# Patient Record
Sex: Female | Born: 1990 | Race: Black or African American | Hispanic: No | Marital: Single | Smoking: Never smoker
Health system: Southern US, Community
[De-identification: ages and names within clinical notes are randomized; demographics above are authoritative.]

## PROBLEM LIST (undated history)

## (undated) ENCOUNTER — Inpatient Hospital Stay (HOSPITAL_COMMUNITY): Payer: Self-pay

## (undated) DIAGNOSIS — Z789 Other specified health status: Secondary | ICD-10-CM

## (undated) HISTORY — PX: HERNIA REPAIR: SHX51

## (undated) HISTORY — PX: NO PAST SURGERIES: SHX2092

## (undated) HISTORY — PX: WISDOM TOOTH EXTRACTION: SHX21

---

## 2012-12-24 NOTE — L&D Delivery Note (Signed)
Delivery Note At 3:25 AM a viable female was delivered via Vaginal, Spontaneous Delivery (Presentation:LOA ;  ).  APGAR: 9, 9; weight .   Placenta status: Intact, Spontaneous.  Cord: 3 vessels with the following complications: None.  Cord pH: n/a  Anesthesia: None  Episiotomy: None Lacerations: None Suture Repair: n/a Est. Blood Loss (mL): 300  Mom to postpartum.  Baby to Couplet care / Skin to Skin.  LAWSON, MARIE DARLENE 11/22/2013, 3:35 AM

## 2013-08-31 LAB — OB RESULTS CONSOLE HIV ANTIBODY (ROUTINE TESTING): HIV: NONREACTIVE

## 2013-09-07 ENCOUNTER — Encounter (HOSPITAL_COMMUNITY): Payer: Self-pay

## 2013-09-07 ENCOUNTER — Emergency Department (HOSPITAL_COMMUNITY)
Admission: EM | Admit: 2013-09-07 | Discharge: 2013-09-07 | Disposition: A | Discharge status: Home / Self Care | Payer: Medicaid Other | Attending: Emergency Medicine | Admitting: Emergency Medicine

## 2013-09-07 DIAGNOSIS — E860 Dehydration: Secondary | ICD-10-CM | POA: Insufficient documentation

## 2013-09-07 DIAGNOSIS — R42 Dizziness and giddiness: Secondary | ICD-10-CM | POA: Insufficient documentation

## 2013-09-07 DIAGNOSIS — R55 Syncope and collapse: Secondary | ICD-10-CM | POA: Insufficient documentation

## 2013-09-07 DIAGNOSIS — O9989 Other specified diseases and conditions complicating pregnancy, childbirth and the puerperium: Secondary | ICD-10-CM | POA: Insufficient documentation

## 2013-09-07 DIAGNOSIS — R109 Unspecified abdominal pain: Secondary | ICD-10-CM | POA: Insufficient documentation

## 2013-09-07 DIAGNOSIS — Z3490 Encounter for supervision of normal pregnancy, unspecified, unspecified trimester: Secondary | ICD-10-CM

## 2013-09-07 HISTORY — DX: Other specified health status: Z78.9

## 2013-09-07 LAB — URINALYSIS, ROUTINE W REFLEX MICROSCOPIC
Ketones, ur: NEGATIVE mg/dL
Leukocytes, UA: NEGATIVE
Nitrite: NEGATIVE
Specific Gravity, Urine: 1.011 (ref 1.005–1.030)
pH: 7 (ref 5.0–8.0)

## 2013-09-07 LAB — CBC WITH DIFFERENTIAL/PLATELET
Basophils Absolute: 0 10*3/uL (ref 0.0–0.1)
HCT: 28.4 % — ABNORMAL LOW (ref 36.0–46.0)
Lymphocytes Relative: 9 % — ABNORMAL LOW (ref 12–46)
Monocytes Relative: 10 % (ref 3–12)
Platelets: 318 10*3/uL (ref 150–400)
RDW: 12.9 % (ref 11.5–15.5)
WBC: 16.5 10*3/uL — ABNORMAL HIGH (ref 4.0–10.5)

## 2013-09-07 LAB — BASIC METABOLIC PANEL
Chloride: 105 mEq/L (ref 96–112)
GFR calc Af Amer: >90 mL/min (ref >90)
Potassium: 3.9 mEq/L (ref 3.5–5.1)

## 2013-09-07 MED ORDER — SODIUM CHLORIDE 0.9 % IV BOLUS (SEPSIS)
1000.0000 mL | Freq: Once | INTRAVENOUS | Status: AC
  Administered 2013-09-07: 1000 mL via INTRAVENOUS

## 2013-09-07 NOTE — ED Notes (Signed)
Pt states she is [redacted] weeks pregnant, became dizzy when getting ready this morning, sat down, then went to walk into living room when she became weak and off balance and fell on bed, states she fell on R side, pt states some lower abdominal cramping started after fall, denies syncope episode, denies vaginal bleeding, pt resting a this time, just recently moved here, to see new OB MD on Thursday.

## 2013-09-07 NOTE — ED Notes (Addendum)
Pt c/o dizziness since 0800 and c/o lower abdominal cramping.  Pain score 5/10.  Sts she fell this AM from dizziness.  Pt sts she is "7months pregnant."  Denies vaginal bleeding and discharge.  Sts she has not been eating well.  Vitals are stable.
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
## 2013-09-07 NOTE — ED Notes (Signed)
Rapid Response OB RN at bedside 

## 2013-09-07 NOTE — Progress Notes (Signed)
Dr. Jolayne Panther called reassured FHR tracing for 3.5 hours with one moderate variable lasting 70 secs with contraction every 1-2 an hour. Asked to make sure pt has established care. Pt states she has made appointment with [facility] for this Thursday. Dr. Jolayne Panther stated that pt could be discharged if remaining 30 min monitoring was reassuring, pt established care and give fetal kick counts instructions for discharge.

## 2013-09-07 NOTE — Progress Notes (Signed)
Dr. Bernette Mayers, notified of Dr. Jolayne Panther clearing pt obstetrically. Orders for discharge instructions include fetal kick counts. Dr. Bernette Mayers verified understanding.

## 2013-09-07 NOTE — ED Notes (Signed)
Pt unable to urinate at this time. 1st attempt unsuccessful.

## 2013-09-07 NOTE — Progress Notes (Signed)
At bedside. Report received from Dr. Bernette Mayers. Pt states she "fainted and fell on right side." Pt reports cramping like "menstrual cramps." Reports positive fetal movement. No vaginal bleeding or leaking of fluid. Pt states she is new to the area and has been receiving care with [facility]. And has a scheduled appointment with an OB for next week.
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
## 2013-09-07 NOTE — Progress Notes (Signed)
[facility] ED called regarding pt who at 29 wk 1 d had syncope episode and had lower abd cramping. OB RR RN in route.

## 2013-09-10 LAB — OB RESULTS CONSOLE ABO/RH: RH Type: POSITIVE

## 2013-10-22 LAB — OB RESULTS CONSOLE GC/CHLAMYDIA
Chlamydia: NEGATIVE
Gonorrhea: NEGATIVE

## 2013-10-22 LAB — OB RESULTS CONSOLE GBS: GBS: NEGATIVE

## 2013-11-01 ENCOUNTER — Encounter (HOSPITAL_COMMUNITY): Payer: Self-pay | Admitting: Emergency Medicine

## 2013-11-01 ENCOUNTER — Inpatient Hospital Stay (HOSPITAL_COMMUNITY)
Admission: EM | Admit: 2013-11-01 | Discharge: 2013-11-02 | Disposition: A | Discharge status: Home / Self Care | Payer: No Typology Code available for payment source | Attending: Obstetrics and Gynecology | Admitting: Obstetrics and Gynecology

## 2013-11-01 ENCOUNTER — Inpatient Hospital Stay (HOSPITAL_COMMUNITY): Payer: No Typology Code available for payment source

## 2013-11-01 DIAGNOSIS — Y92410 Unspecified street and highway as the place of occurrence of the external cause: Secondary | ICD-10-CM | POA: Insufficient documentation

## 2013-11-01 DIAGNOSIS — M545 Low back pain, unspecified: Secondary | ICD-10-CM | POA: Insufficient documentation

## 2013-11-01 DIAGNOSIS — R109 Unspecified abdominal pain: Secondary | ICD-10-CM | POA: Insufficient documentation

## 2013-11-01 DIAGNOSIS — O2693 Pregnancy related conditions, unspecified, third trimester: Secondary | ICD-10-CM

## 2013-11-01 DIAGNOSIS — O479 False labor, unspecified: Secondary | ICD-10-CM | POA: Insufficient documentation

## 2013-11-01 LAB — COMPREHENSIVE METABOLIC PANEL
ALT: 12 U/L (ref 0–35)
AST: 22 U/L (ref 0–37)
Albumin: 2.6 g/dL — ABNORMAL LOW (ref 3.5–5.2)
Alkaline Phosphatase: 163 U/L — ABNORMAL HIGH (ref 39–117)
GFR calc Af Amer: >90 mL/min (ref >90)
Glucose, Bld: 75 mg/dL (ref 70–99)
Potassium: 3.7 mEq/L (ref 3.5–5.1)
Sodium: 136 mEq/L (ref 135–145)
Total Protein: 6.7 g/dL (ref 6.0–8.3)

## 2013-11-01 LAB — ABO/RH: ABO/RH(D): O POS

## 2013-11-01 LAB — CBC WITH DIFFERENTIAL/PLATELET
Basophils Absolute: 0 10*3/uL (ref 0.0–0.1)
Eosinophils Absolute: 0.2 10*3/uL (ref 0.0–0.7)
Lymphs Abs: 1.7 10*3/uL (ref 0.7–4.0)
MCH: 29.5 pg (ref 26.0–34.0)
Neutrophils Relative %: 75 % (ref 43–77)
Platelets: 281 10*3/uL (ref 150–400)
RBC: 4.14 MIL/uL (ref 3.87–5.11)
WBC: 13.9 10*3/uL — ABNORMAL HIGH (ref 4.0–10.5)

## 2013-11-01 LAB — URIC ACID: Uric Acid, Serum: 4.5 mg/dL (ref 2.4–7.0)

## 2013-11-01 MED ORDER — LACTATED RINGERS IV BOLUS (SEPSIS)
500.0000 mL | Freq: Once | INTRAVENOUS | Status: AC
  Administered 2013-11-01: 21:00:00 via INTRAVENOUS

## 2013-11-01 MED ORDER — LACTATED RINGERS IV SOLN
INTRAVENOUS | Status: DC
  Administered 2013-11-01: 21:00:00 via INTRAVENOUS

## 2013-11-01 NOTE — Progress Notes (Signed)
RROB gave report to Lauren,[email or web address] -maternity admissions
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
## 2013-11-01 NOTE — ED Notes (Signed)
Fetal HR 140 on transfer
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
## 2013-11-01 NOTE — Progress Notes (Signed)
[facility] here to transport pt to [facility].  Monitors d/c'd
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
## 2013-11-02 DIAGNOSIS — O2690 Pregnancy related conditions, unspecified, unspecified trimester: Secondary | ICD-10-CM

## 2013-11-02 DIAGNOSIS — R1084 Generalized abdominal pain: Secondary | ICD-10-CM

## 2013-11-21 ENCOUNTER — Inpatient Hospital Stay (HOSPITAL_COMMUNITY)
Admission: AD | Admit: 2013-11-21 | Discharge: 2013-11-24 | DRG: 775 | Disposition: A | Discharge status: Home / Self Care | Payer: Medicaid Other | Source: Ambulatory Visit | Attending: Obstetrics and Gynecology | Admitting: Obstetrics and Gynecology

## 2013-11-21 DIAGNOSIS — IMO0001 Reserved for inherently not codable concepts without codable children: Secondary | ICD-10-CM

## 2013-11-22 ENCOUNTER — Encounter (HOSPITAL_COMMUNITY): Payer: Self-pay | Admitting: *Deleted

## 2013-11-22 DIAGNOSIS — IMO0001 Reserved for inherently not codable concepts without codable children: Secondary | ICD-10-CM

## 2013-11-22 LAB — CBC
HCT: 35.1 % — ABNORMAL LOW (ref 36.0–46.0)
Hemoglobin: 11.7 g/dL — ABNORMAL LOW (ref 12.0–15.0)
MCHC: 33.3 g/dL (ref 30.0–36.0)
MCV: 85.6 fL (ref 78.0–100.0)
RBC: 4.1 MIL/uL (ref 3.87–5.11)
WBC: 20.7 10*3/uL — ABNORMAL HIGH (ref 4.0–10.5)

## 2013-11-22 MED ORDER — CITRIC ACID-SODIUM CITRATE 334-500 MG/5ML PO SOLN: 30.0000 mL | ORAL | Status: DC | PRN

## 2013-11-22 MED ORDER — DIBUCAINE 1 % RE OINT: 1.0000 "application " | TOPICAL_OINTMENT | RECTAL | Status: DC | PRN

## 2013-11-22 MED ORDER — PRENATAL MULTIVITAMIN CH
1.0000 | ORAL_TABLET | Freq: Every day | ORAL | Status: DC
  Administered 2013-11-22 – 2013-11-24 (×3): 1 via ORAL
  Filled 2013-11-22 (×3): qty 1

## 2013-11-22 MED ORDER — ONDANSETRON HCL 4 MG/2ML IJ SOLN: 4.0000 mg | INTRAMUSCULAR | Status: DC | PRN

## 2013-11-22 MED ORDER — ZOLPIDEM TARTRATE 5 MG PO TABS: 5.0000 mg | ORAL_TABLET | Freq: Every evening | ORAL | Status: DC | PRN

## 2013-11-22 MED ORDER — SENNOSIDES-DOCUSATE SODIUM 8.6-50 MG PO TABS
2.0000 | ORAL_TABLET | ORAL | Status: DC
  Administered 2013-11-22 – 2013-11-24 (×2): 2 via ORAL
  Filled 2013-11-22 (×2): qty 2

## 2013-11-22 MED ORDER — OXYTOCIN BOLUS FROM INFUSION
500.0000 mL | INTRAVENOUS | Status: DC
  Administered 2013-11-22: 500 mL via INTRAVENOUS

## 2013-11-22 MED ORDER — IBUPROFEN 600 MG PO TABS
600.0000 mg | ORAL_TABLET | Freq: Four times a day (QID) | ORAL | Status: DC
  Administered 2013-11-22 – 2013-11-24 (×9): 600 mg via ORAL
  Filled 2013-11-22 (×9): qty 1

## 2013-11-22 MED ORDER — LIDOCAINE HCL (PF) 1 % IJ SOLN
30.0000 mL | INTRAMUSCULAR | Status: DC | PRN
  Filled 2013-11-22 (×2): qty 30

## 2013-11-22 MED ORDER — OXYCODONE-ACETAMINOPHEN 5-325 MG PO TABS: 1.0000 | ORAL_TABLET | ORAL | Status: DC | PRN

## 2013-11-22 MED ORDER — BUTORPHANOL TARTRATE 1 MG/ML IJ SOLN
1.0000 mg | INTRAMUSCULAR | Status: DC | PRN
  Administered 2013-11-22: 1 mg via INTRAVENOUS
  Filled 2013-11-22: qty 1

## 2013-11-22 MED ORDER — LACTATED RINGERS IV SOLN: 500.0000 mL | INTRAVENOUS | Status: DC | PRN

## 2013-11-22 MED ORDER — IBUPROFEN 600 MG PO TABS
600.0000 mg | ORAL_TABLET | Freq: Four times a day (QID) | ORAL | Status: DC | PRN
  Administered 2013-11-22: 600 mg via ORAL
  Filled 2013-11-22: qty 1

## 2013-11-22 MED ORDER — LACTATED RINGERS IV SOLN
INTRAVENOUS | Status: DC
  Administered 2013-11-22 (×2): via INTRAVENOUS

## 2013-11-22 MED ORDER — SIMETHICONE 80 MG PO CHEW: 80.0000 mg | CHEWABLE_TABLET | ORAL | Status: DC | PRN

## 2013-11-22 MED ORDER — OXYTOCIN 40 UNITS IN LACTATED RINGERS INFUSION - SIMPLE MED
62.5000 mL/h | INTRAVENOUS | Status: DC
  Filled 2013-11-22: qty 1000

## 2013-11-22 MED ORDER — FLEET ENEMA 7-19 GM/118ML RE ENEM: 1.0000 | ENEMA | Freq: Once | RECTAL | Status: DC

## 2013-11-22 MED ORDER — BENZOCAINE-MENTHOL 20-0.5 % EX AERO: 1.0000 "application " | INHALATION_SPRAY | CUTANEOUS | Status: DC | PRN

## 2013-11-22 MED ORDER — LANOLIN HYDROUS EX OINT: TOPICAL_OINTMENT | CUTANEOUS | Status: DC | PRN

## 2013-11-22 MED ORDER — TETANUS-DIPHTH-ACELL PERTUSSIS 5-2.5-18.5 LF-MCG/0.5 IM SUSP: 0.5000 mL | Freq: Once | INTRAMUSCULAR | Status: DC

## 2013-11-22 MED ORDER — DIPHENHYDRAMINE HCL 25 MG PO CAPS: 25.0000 mg | ORAL_CAPSULE | Freq: Four times a day (QID) | ORAL | Status: DC | PRN

## 2013-11-22 MED ORDER — ONDANSETRON HCL 4 MG/2ML IJ SOLN: 4.0000 mg | Freq: Four times a day (QID) | INTRAMUSCULAR | Status: DC | PRN

## 2013-11-22 MED ORDER — PROMETHAZINE HCL 25 MG/ML IJ SOLN
12.5000 mg | Freq: Four times a day (QID) | INTRAMUSCULAR | Status: DC | PRN
  Administered 2013-11-22: 12.5 mg via INTRAVENOUS
  Filled 2013-11-22: qty 1

## 2013-11-22 MED ORDER — BUTORPHANOL TARTRATE 1 MG/ML IJ SOLN: 1.0000 mg | INTRAMUSCULAR | Status: DC | PRN

## 2013-11-22 MED ORDER — ONDANSETRON HCL 4 MG PO TABS: 4.0000 mg | ORAL_TABLET | ORAL | Status: DC | PRN

## 2013-11-22 MED ORDER — ACETAMINOPHEN 325 MG PO TABS: 650.0000 mg | ORAL_TABLET | ORAL | Status: DC | PRN

## 2013-11-22 MED ORDER — WITCH HAZEL-GLYCERIN EX PADS: 1.0000 "application " | MEDICATED_PAD | CUTANEOUS | Status: DC | PRN

## 2013-11-22 NOTE — Progress Notes (Signed)
Patient ID: Carrie Moss, female   DOB: 04-01-1991, 22 y.o.   MRN: [id number] DOD Doing well, no c/o. Baby resting with patient. D/w pt risks and benefits and she desires to proceed. Will pay cashier today and plan in AM

## 2013-11-22 NOTE — Progress Notes (Signed)
This note also relates to the following rows which could not be included: Pulse Rate - Cannot attach notes to unvalidated device data SpO2 - Cannot attach notes to unvalidated device data   Pt waking for uc's and placing hand under [place] monitor, adjusting monitors.

## 2013-11-22 NOTE — Progress Notes (Signed)
Acknowledged order for social work consult for late and limited prenatal care.  Spoke with patient's nurse and informed that patient did receive adequate PNC in [place].  No acute social work needs were identified at this time.  Please contact the Clinical Social Worker if needs arise, or by the patient's request. Jenny Omdahl J, LCSW

## 2013-11-22 NOTE — Lactation Note (Addendum)
This note was copied from the chart of Carrie Moss. Lactation Consultation Note     Initial consult with this mom and term baby, now 45 hours old. This is mom's first baby. The baby latched and breast fed well prior to my visit, but was sleepy and spitty  at this time. I assisted mom with both football and cross cradle hold, but baby left skin to skin with mom,sue to no latch/sleepy/ Baby jittery, but one touch done by CNS was 60. Benefits of skin to skin reviewed with mom, as well as cue based and cluster feeding. Breast feeding pages of the Baby and Me book reviewed with mom, as well as the lactation services. I gave mom a hand pump, and instructed her in it's use.Mom is active with WIC. I advised her to call them tomorrow, and add the baby, and let them know she will need a DEP for school. Mom goes to [employer]. Mom knows to call for questions/concerns. I also gave mom a DEP kit to bring to [facility]  Patient Name: Carrie Otila Starn [id number] Date: 11/22/2013 Reason for consult: Initial assessment   Maternal Data Formula Feeding for Exclusion: No Infant to breast within first hour of birth: Yes Has patient been taught Hand Expression?: Yes Does the patient have breastfeeding experience prior to this delivery?: No  Feeding Feeding Type: Breast Fed Length of feed: 14 min  LATCH Score/Interventions Latch: Too sleepy or reluctant, no latch achieved, no sucking elicited. (baby also burping and spitty clear, frothy mucous, small amount) Intervention(s): Skin to skin;Teach feeding cues;Waking techniques Intervention(s): Adjust position;Assist with latch;Breast massage;Breast compression  Audible Swallowing: None Intervention(s): Skin to skin;Hand expression  Type of Nipple: Everted at rest and after stimulation  Comfort (Breast/Nipple): Soft / non-tender     Hold (Positioning): Assistance needed to correctly position infant at breast and maintain latch. Intervention(s): Breastfeeding basics  reviewed;Support Pillows;Position options;Skin to skin  LATCH Score: 5  Lactation Tools Discussed/Used Tools: Pump Breast pump type: Manual WIC Program: Yes (mom will call tomorrwo to add baby and for DEP)   Consult Status Consult Status: Follow-up Date: 11/23/13 Follow-up type: In-patient    Alfred Levins 11/22/2013, 12:49 PM

## 2013-11-22 NOTE — Progress Notes (Signed)
Had prenatal care in [place] prior to moving to this area at 29 wks. Korea at 15/16 wks. Preg. Done. Drug screen canceled per pediatrician.

## 2013-11-22 NOTE — Progress Notes (Signed)
Called for faculty standby in case provider does not make delivery in time

## 2013-11-22 NOTE — H&P (Signed)
Laurena Valko is a 22 y.o. female G1P0 at 40 weeks (EDD 11/22/13 by LMP c/w 15 week [place]) who presented to hospital  for c/o contractions.  She was examined on admission and found to be 3-4 cm at midnight, initially stating she wanted an epidural, but changed her mind and took stadol x 1 instead.  Reexamination at 0130 did not show any change with the patient being 3-4 cm still.  I was called at 300am and told the patient was 9cm.  I came immediately to hospital but found the patient delivered on arrival by the midwife with baby on chest for skin-to-skin and placenta delivered as well.   I was told there were no lacerations. Prenatal care was uncomplicated except for transfer into our office at 29 weeks and an early placenta previa which resolved on f/u US at 32 weeks.  Maternal Medical History:  Reason for admission: Contractions.   Contractions: Onset was 3-5 hours ago.   Frequency: regular.   Perceived severity is strong.    Fetal activity: Perceived fetal activity is normal.    Prenatal Complications - Diabetes: none.    OB History   Grav Para Term Preterm Abortions TAB SAB Ect Mult Living   1 1 1       1      Past Medical History  Diagnosis Date  . Medical history non-contributory    Past Surgical History  Procedure Laterality Date  . No past surgeries    . Wisdom tooth extraction    . Hernia repair     Family History: family history is not on file. Social History:  reports that she has never smoked. She does not have any smokeless tobacco history on file. She reports that she does not drink alcohol or use illicit drugs.   Prenatal Transfer Tool  Maternal Diabetes: No Genetic Screening: Normal Maternal Ultrasounds/Referrals: Normal Fetal Ultrasounds or other Referrals:  None Maternal Substance Abuse:  No Significant Maternal Medications:  None Significant Maternal Lab Results:  None Other Comments:  None  ROS  Dilation: 10 Effacement (%): 100 Station: +1;+2 Exam  by:: B. Cagna, RN Blood pressure 133/67, pulse 109, temperature 99 F (37.2 C), temperature source Oral, resp. rate 20, height 5\' 1"  (1.549 m), weight 76.204 kg (168 lb), SpO2 97.00%, unknown if currently breastfeeding. Maternal Exam:  Uterine Assessment: Contraction strength is firm.  Contraction frequency is regular.   Abdomen: Patient reports no abdominal tenderness. Fetal presentation: vertex  Introitus: Normal vulva. Normal vagina.    Physical Exam  Constitutional: She is oriented to person, place, and time. She appears well-developed and well-nourished.  Cardiovascular: Normal rate and regular rhythm.   Respiratory: Effort normal.  GI: Soft.  Genitourinary: Vagina normal.  Neurological: She is alert and oriented to person, place, and time.  Psychiatric: Her behavior is normal.    Prenatal labs: ABO, Rh: --/--/O POS (11/09 2120) Antibody: Negative (09/08 0000) Rubella: Immune (09/08 0000) RPR: Nonreactive (09/08 0000)  HBsAg: Negative (09/08 0000)  HIV: Non-reactive (09/08 0000)  GBS: Negative (10/30 0000)  One hour GTT 97 Quad screen negative Hgb AA  Assessment/Plan: Pt doing well s/p precipitous labor and delivery.  Baby skin-to-skin.  Patient states she would like circumcision performed in hospital.  Oliver Pila 11/22/2013, 3:46 AM

## 2013-11-23 LAB — CBC
HCT: 34.9 % — ABNORMAL LOW (ref 36.0–46.0)
Hemoglobin: 11.7 g/dL — ABNORMAL LOW (ref 12.0–15.0)
MCH: 28.7 pg (ref 26.0–34.0)
MCV: 85.5 fL (ref 78.0–100.0)
Platelets: 246 10*3/uL (ref 150–400)
RBC: 4.08 MIL/uL (ref 3.87–5.11)
WBC: 19.4 10*3/uL — ABNORMAL HIGH (ref 4.0–10.5)

## 2013-11-23 NOTE — Progress Notes (Signed)
Post Partum Day 1 Subjective: no complaints, up ad lib and tolerating PO  Objective: Blood pressure 109/53, pulse 88, temperature 98.1 F (36.7 C), temperature source Oral, resp. rate 18, height 5\' 1"  (1.549 m), weight 76.204 kg (168 lb), SpO2 100.00%, unknown if currently breastfeeding.  Physical Exam:  General: alert and cooperative Lochia: appropriate Uterine Fundus: firm    Recent Labs  11/22/13 0815 11/23/13 0535  HGB 11.7* 11.7*  HCT 35.1* 34.9*    Assessment/Plan: Plan for discharge tomorrow    LOS: 2 days   Carrie Moss W 11/23/2013, 9:21 AM

## 2013-11-23 NOTE — Progress Notes (Signed)
Has nipple pierced in past. No rings in at present time. Noted colostrum excreting out of piercing sites. Colostrum has dark pink/orange color. Hand pump demonstrated. Attempted to hand express and spoon feed, could only bring colostrum to end of nipple. Hand pumped approximately 3ml colostrum. Pt. Spoon fed baby to encourage to breast feed. Mom using nipple shield. Noted smacking noises. Baby positioned incorrectly. Mom needs much prompting on positioning. Encouraged FOB to participate in positioning. LC notified and assisted pt. In feeding.

## 2013-11-24 MED ORDER — IBUPROFEN 600 MG PO TABS: 600.0000 mg | ORAL_TABLET | Freq: Four times a day (QID) | ORAL | Status: DC

## 2013-11-24 NOTE — Progress Notes (Signed)
Post Partum Day 2 Subjective: no complaints and up ad lib  Objective: Blood pressure 119/78, pulse 102, temperature 98.8 F (37.1 C), temperature source Oral, resp. rate 20, height 5\' 1"  (1.549 m), weight 76.204 kg (168 lb), SpO2 100.00%, unknown if currently breastfeeding.  Physical Exam:  General: alert and cooperative Lochia: appropriate Uterine Fundus: firm    Recent Labs  11/22/13 0815 11/23/13 0535  HGB 11.7* 11.7*  HCT 35.1* 34.9*    Assessment/Plan: Discharge home   LOS: 3 days   Carrie Moss 11/24/2013, 10:53 AM

## 2013-11-24 NOTE — Discharge Summary (Signed)
Obstetric Discharge Summary Reason for Admission: onset of labor Prenatal Procedures: none Intrapartum Procedures: spontaneous vaginal delivery (precipitous) Postpartum Procedures: none Complications-Operative and Postpartum: none Hemoglobin  Date Value Range Status  11/23/2013 11.7* 12.0 - 15.0 g/dL Final     HCT  Date Value Range Status  11/23/2013 34.9* 36.0 - 46.0 % Final    Physical Exam:  General: alert and cooperative Lochia: appropriate Uterine Fundus: firm   Discharge Diagnoses: Term Pregnancy-delivered  Discharge Information: Date: 11/24/2013 Activity: pelvic rest Diet: routine Medications: Ibuprofen Condition: improved Instructions: refer to practice specific booklet Discharge to: home Follow-up Information   Follow up with Oliver Pila, MD In 6 weeks.   Specialty:  Obstetrics and Gynecology   Contact information:   [address], [address] [place] [address] [phone]       Newborn Data: Live born female  Birth Weight: 6 lb 9.6 oz (2995 g) APGAR: 9, 9  Home with mother.  Oliver Pila 11/24/2013, 10:55 AM

## 2014-10-25 ENCOUNTER — Encounter (HOSPITAL_COMMUNITY): Payer: Self-pay | Admitting: *Deleted

## 2019-02-25 ENCOUNTER — Other Ambulatory Visit: Payer: Self-pay | Admitting: Nurse Practitioner

## 2019-02-25 DIAGNOSIS — N63 Unspecified lump in unspecified breast: Secondary | ICD-10-CM

## 2019-03-04 ENCOUNTER — Other Ambulatory Visit: Payer: Self-pay | Admitting: Nurse Practitioner

## 2019-03-05 ENCOUNTER — Ambulatory Visit
Admission: RE | Admit: 2019-03-05 | Discharge: 2019-03-05 | Disposition: A | Discharge status: Home / Self Care | Payer: Medicaid Other | Source: Ambulatory Visit | Attending: Nurse Practitioner | Admitting: Nurse Practitioner

## 2019-03-05 ENCOUNTER — Other Ambulatory Visit: Payer: Self-pay

## 2019-03-05 DIAGNOSIS — N63 Unspecified lump in unspecified breast: Secondary | ICD-10-CM

## 2020-01-07 ENCOUNTER — Emergency Department (HOSPITAL_COMMUNITY)
Admission: EM | Admit: 2020-01-07 | Discharge: 2020-01-07 | Disposition: A | Discharge status: Home / Self Care | Payer: Medicaid Other | Attending: Emergency Medicine | Admitting: Emergency Medicine

## 2020-01-07 ENCOUNTER — Encounter (HOSPITAL_COMMUNITY): Payer: Self-pay | Admitting: Emergency Medicine

## 2020-01-07 ENCOUNTER — Other Ambulatory Visit: Payer: Self-pay

## 2020-01-07 DIAGNOSIS — Z5321 Procedure and treatment not carried out due to patient leaving prior to being seen by health care provider: Secondary | ICD-10-CM | POA: Insufficient documentation

## 2020-01-07 DIAGNOSIS — M546 Pain in thoracic spine: Secondary | ICD-10-CM | POA: Diagnosis not present
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
## 2020-01-19 ENCOUNTER — Emergency Department (HOSPITAL_COMMUNITY)
Admission: EM | Admit: 2020-01-19 | Discharge: 2020-01-19 | Disposition: A | Discharge status: Home / Self Care | Payer: Medicaid Other | Attending: Emergency Medicine | Admitting: Emergency Medicine

## 2020-01-19 ENCOUNTER — Encounter (HOSPITAL_COMMUNITY): Payer: Self-pay

## 2020-01-19 DIAGNOSIS — M542 Cervicalgia: Secondary | ICD-10-CM | POA: Diagnosis present

## 2020-01-19 DIAGNOSIS — Y92410 Unspecified street and highway as the place of occurrence of the external cause: Secondary | ICD-10-CM | POA: Diagnosis not present

## 2020-01-19 DIAGNOSIS — Z79899 Other long term (current) drug therapy: Secondary | ICD-10-CM | POA: Diagnosis not present

## 2020-01-19 DIAGNOSIS — Y93I9 Activity, other involving external motion: Secondary | ICD-10-CM | POA: Insufficient documentation

## 2020-01-19 DIAGNOSIS — M546 Pain in thoracic spine: Secondary | ICD-10-CM | POA: Diagnosis not present

## 2020-01-19 DIAGNOSIS — Y999 Unspecified external cause status: Secondary | ICD-10-CM | POA: Insufficient documentation

## 2020-01-19 MED ORDER — METHOCARBAMOL 500 MG PO TABS: 500.0000 mg | ORAL_TABLET | Freq: Two times a day (BID) | ORAL | 0 refills | Status: AC

## 2020-01-19 MED ORDER — HYDROXYZINE HCL 25 MG PO TABS: 25.0000 mg | ORAL_TABLET | Freq: Four times a day (QID) | ORAL | 0 refills | Status: AC

## 2020-01-19 NOTE — Discharge Instructions (Signed)
Please use Tylenol or ibuprofen for pain.  You may use 600 mg ibuprofen every 6 hours or 1000 mg of Tylenol every 6 hours.  You may choose to alternate between the 2.  This would be most effective.  Not to exceed 4 g of Tylenol within 24 hours.  Not to exceed 3200 mg ibuprofen 24 hours.  Also prescribe you Robaxin.  He can use this as needed for pain.  Please use only as much time ibuprofen as needed however usually sufficient quantities in order to help your pain.
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
## 2020-02-03 ENCOUNTER — Encounter (HOSPITAL_COMMUNITY): Payer: Self-pay | Admitting: *Deleted

## 2020-02-03 ENCOUNTER — Emergency Department (HOSPITAL_COMMUNITY)
Admission: EM | Admit: 2020-02-03 | Discharge: 2020-02-03 | Disposition: A | Discharge status: Home / Self Care | Payer: Medicaid Other | Attending: Emergency Medicine | Admitting: Emergency Medicine

## 2020-02-03 ENCOUNTER — Other Ambulatory Visit: Payer: Self-pay

## 2020-02-03 ENCOUNTER — Emergency Department (HOSPITAL_COMMUNITY): Payer: Medicaid Other

## 2020-02-03 DIAGNOSIS — Z79899 Other long term (current) drug therapy: Secondary | ICD-10-CM | POA: Diagnosis not present

## 2020-02-03 DIAGNOSIS — M549 Dorsalgia, unspecified: Secondary | ICD-10-CM | POA: Insufficient documentation

## 2020-02-03 LAB — PREGNANCY, URINE: Preg Test, Ur: NEGATIVE

## 2020-02-03 MED ORDER — IBUPROFEN 600 MG PO TABS: 600.0000 mg | ORAL_TABLET | Freq: Four times a day (QID) | ORAL | 0 refills | Status: AC | PRN

## 2020-02-03 MED ORDER — CYCLOBENZAPRINE HCL 5 MG PO TABS: 5.0000 mg | ORAL_TABLET | Freq: Two times a day (BID) | ORAL | 0 refills | Status: AC | PRN

## 2020-02-03 NOTE — Discharge Instructions (Addendum)
Your xrays are normal.  Take medications prescribed and follow up with orthopedist as needed.
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
